# Patient Record
Sex: Female | Born: 1970 | Race: White | Hispanic: No | Marital: Married
Health system: Southern US, Community
[De-identification: ages and names within clinical notes are randomized; demographics above are authoritative.]

## PROBLEM LIST (undated history)

## (undated) DIAGNOSIS — I10 Essential (primary) hypertension: Secondary | ICD-10-CM

## (undated) DIAGNOSIS — E538 Deficiency of other specified B group vitamins: Secondary | ICD-10-CM

## (undated) DIAGNOSIS — E119 Type 2 diabetes mellitus without complications: Secondary | ICD-10-CM

## (undated) HISTORY — PX: CHOLECYSTECTOMY: SHX55

---

## 2001-09-16 ENCOUNTER — Other Ambulatory Visit: Admission: RE | Admit: 2001-09-16 | Discharge: 2001-09-16 | Payer: Self-pay | Admitting: Obstetrics and Gynecology

## 2002-09-27 ENCOUNTER — Other Ambulatory Visit: Admission: RE | Admit: 2002-09-27 | Discharge: 2002-09-27 | Payer: Self-pay | Admitting: Obstetrics and Gynecology

## 2003-10-20 ENCOUNTER — Other Ambulatory Visit: Admission: RE | Admit: 2003-10-20 | Discharge: 2003-10-20 | Payer: Self-pay | Admitting: Obstetrics and Gynecology

## 2004-11-26 ENCOUNTER — Ambulatory Visit (HOSPITAL_COMMUNITY): Admission: RE | Admit: 2004-11-26 | Discharge: 2004-11-26 | Payer: Self-pay | Admitting: Obstetrics and Gynecology

## 2005-02-19 ENCOUNTER — Other Ambulatory Visit: Admission: RE | Admit: 2005-02-19 | Discharge: 2005-02-19 | Payer: Self-pay | Admitting: Obstetrics and Gynecology

## 2005-08-23 ENCOUNTER — Emergency Department (HOSPITAL_COMMUNITY): Admission: EM | Admit: 2005-08-23 | Discharge: 2005-08-23 | Payer: Self-pay | Admitting: Emergency Medicine

## 2005-11-19 ENCOUNTER — Inpatient Hospital Stay (HOSPITAL_COMMUNITY): Admission: AD | Admit: 2005-11-19 | Discharge: 2005-11-19 | Payer: Self-pay | Admitting: Obstetrics and Gynecology

## 2005-11-21 ENCOUNTER — Inpatient Hospital Stay (HOSPITAL_COMMUNITY): Admission: AD | Admit: 2005-11-21 | Discharge: 2005-11-21 | Payer: Self-pay | Admitting: Obstetrics and Gynecology

## 2005-11-24 ENCOUNTER — Inpatient Hospital Stay (HOSPITAL_COMMUNITY): Admission: AD | Admit: 2005-11-24 | Discharge: 2005-11-25 | Payer: Self-pay | Admitting: Obstetrics and Gynecology

## 2005-11-26 ENCOUNTER — Inpatient Hospital Stay (HOSPITAL_COMMUNITY): Admission: AD | Admit: 2005-11-26 | Discharge: 2005-11-28 | Payer: Self-pay | Admitting: Obstetrics and Gynecology

## 2009-11-30 ENCOUNTER — Ambulatory Visit: Payer: Self-pay | Admitting: Internal Medicine

## 2013-12-07 ENCOUNTER — Other Ambulatory Visit: Payer: Self-pay | Admitting: Obstetrics and Gynecology

## 2013-12-09 LAB — CYTOLOGY - PAP

## 2018-03-23 ENCOUNTER — Ambulatory Visit
Admission: RE | Admit: 2018-03-23 | Discharge: 2018-03-23 | Disposition: A | Discharge status: Home / Self Care | Payer: Managed Care, Other (non HMO) | Source: Ambulatory Visit | Attending: Obstetrics and Gynecology | Admitting: Obstetrics and Gynecology

## 2018-03-23 ENCOUNTER — Other Ambulatory Visit: Payer: Self-pay | Admitting: Obstetrics and Gynecology

## 2018-03-23 DIAGNOSIS — Z1231 Encounter for screening mammogram for malignant neoplasm of breast: Secondary | ICD-10-CM | POA: Diagnosis not present

## 2019-02-18 ENCOUNTER — Other Ambulatory Visit: Payer: Self-pay | Admitting: Internal Medicine

## 2019-02-18 DIAGNOSIS — Z1231 Encounter for screening mammogram for malignant neoplasm of breast: Secondary | ICD-10-CM

## 2019-03-30 ENCOUNTER — Ambulatory Visit
Admission: RE | Admit: 2019-03-30 | Discharge: 2019-03-30 | Disposition: A | Discharge status: Home / Self Care | Payer: Managed Care, Other (non HMO) | Source: Ambulatory Visit | Attending: Internal Medicine | Admitting: Internal Medicine

## 2019-03-30 DIAGNOSIS — Z1231 Encounter for screening mammogram for malignant neoplasm of breast: Secondary | ICD-10-CM | POA: Diagnosis not present

## 2019-09-26 ENCOUNTER — Other Ambulatory Visit: Payer: Self-pay

## 2019-09-26 ENCOUNTER — Other Ambulatory Visit
Admission: RE | Admit: 2019-09-26 | Discharge: 2019-09-26 | Disposition: A | Discharge status: Home / Self Care | Payer: 59 | Source: Ambulatory Visit | Attending: Internal Medicine | Admitting: Internal Medicine

## 2019-09-26 DIAGNOSIS — Z01812 Encounter for preprocedural laboratory examination: Secondary | ICD-10-CM | POA: Insufficient documentation

## 2019-09-26 DIAGNOSIS — Z20822 Contact with and (suspected) exposure to covid-19: Secondary | ICD-10-CM | POA: Diagnosis not present

## 2019-09-27 ENCOUNTER — Encounter: Payer: Self-pay | Admitting: Internal Medicine

## 2019-09-27 LAB — SARS CORONAVIRUS 2 (TAT 6-24 HRS): SARS Coronavirus 2: NEGATIVE

## 2019-09-28 ENCOUNTER — Ambulatory Visit
Admission: RE | Admit: 2019-09-28 | Discharge: 2019-09-28 | Disposition: A | Discharge status: Home / Self Care | Payer: 59 | Attending: Internal Medicine | Admitting: Internal Medicine

## 2019-09-28 ENCOUNTER — Ambulatory Visit: Payer: 59 | Admitting: Anesthesiology

## 2019-09-28 ENCOUNTER — Encounter
Admission: RE | Disposition: A | Discharge status: Home / Self Care | Payer: Self-pay | Source: Home / Self Care | Attending: Internal Medicine

## 2019-09-28 ENCOUNTER — Other Ambulatory Visit: Payer: Self-pay

## 2019-09-28 ENCOUNTER — Encounter: Payer: Self-pay | Admitting: Internal Medicine

## 2019-09-28 DIAGNOSIS — E119 Type 2 diabetes mellitus without complications: Secondary | ICD-10-CM | POA: Insufficient documentation

## 2019-09-28 DIAGNOSIS — D120 Benign neoplasm of cecum: Secondary | ICD-10-CM | POA: Diagnosis not present

## 2019-09-28 DIAGNOSIS — Z79899 Other long term (current) drug therapy: Secondary | ICD-10-CM | POA: Diagnosis not present

## 2019-09-28 DIAGNOSIS — Z7982 Long term (current) use of aspirin: Secondary | ICD-10-CM | POA: Insufficient documentation

## 2019-09-28 DIAGNOSIS — K219 Gastro-esophageal reflux disease without esophagitis: Secondary | ICD-10-CM | POA: Diagnosis not present

## 2019-09-28 DIAGNOSIS — Z8371 Family history of colonic polyps: Secondary | ICD-10-CM | POA: Insufficient documentation

## 2019-09-28 DIAGNOSIS — Z1211 Encounter for screening for malignant neoplasm of colon: Secondary | ICD-10-CM | POA: Diagnosis not present

## 2019-09-28 DIAGNOSIS — Z7984 Long term (current) use of oral hypoglycemic drugs: Secondary | ICD-10-CM | POA: Insufficient documentation

## 2019-09-28 DIAGNOSIS — K640 First degree hemorrhoids: Secondary | ICD-10-CM | POA: Diagnosis not present

## 2019-09-28 DIAGNOSIS — I10 Essential (primary) hypertension: Secondary | ICD-10-CM | POA: Diagnosis not present

## 2019-09-28 HISTORY — DX: Deficiency of other specified B group vitamins: E53.8

## 2019-09-28 HISTORY — DX: Essential (primary) hypertension: I10

## 2019-09-28 HISTORY — DX: Type 2 diabetes mellitus without complications: E11.9

## 2019-09-28 HISTORY — PX: COLONOSCOPY WITH PROPOFOL: SHX5780

## 2019-09-28 LAB — GLUCOSE, CAPILLARY: Glucose-Capillary: 107 mg/dL — ABNORMAL HIGH (ref 70–99)

## 2019-09-28 SURGERY — COLONOSCOPY WITH PROPOFOL
Anesthesia: General

## 2019-09-28 MED ORDER — SODIUM CHLORIDE 0.9 % IV SOLN
INTRAVENOUS | Status: DC
  Administered 2019-09-28: 20 mL/h via INTRAVENOUS

## 2019-09-28 MED ORDER — PROPOFOL 500 MG/50ML IV EMUL
INTRAVENOUS | Status: AC
  Filled 2019-09-28: qty 50

## 2019-09-28 MED ORDER — PROPOFOL 500 MG/50ML IV EMUL
INTRAVENOUS | Status: DC | PRN
  Administered 2019-09-28: 150 ug/kg/min via INTRAVENOUS

## 2019-09-28 NOTE — Anesthesia Postprocedure Evaluation (Signed)
Anesthesia Post Note  Patient: Nicole Haas  Procedure(s) Performed: COLONOSCOPY WITH PROPOFOL (N/A )  Patient location during evaluation: Endoscopy Anesthesia Type: General Level of consciousness: awake and alert Pain management: pain level controlled Vital Signs Assessment: post-procedure vital signs reviewed and stable Respiratory status: spontaneous breathing, nonlabored ventilation, respiratory function stable and patient connected to nasal cannula oxygen Cardiovascular status: blood pressure returned to baseline and stable Postop Assessment: no apparent nausea or vomiting Anesthetic complications: no   No complications documented.   Last Vitals:  Vitals:   09/28/19 0918 09/28/19 0928  BP: 109/78 (!) 120/99  Pulse: 94 90  Resp: 17 13  Temp:    SpO2: 98% 98%    Last Pain:  Vitals:   09/28/19 0928  TempSrc:   PainSc: 0-No pain                 Arita Miss

## 2019-09-28 NOTE — Interval H&P Note (Signed)
History and Physical Interval Note:  09/28/2019 8:53 AM  Nicole Haas  has presented today for surgery, with the diagnosis of FH POLYPS.  The various methods of treatment have been discussed with the patient and family. After consideration of risks, benefits and other options for treatment, the patient has consented to  Procedure(s): COLONOSCOPY WITH PROPOFOL (N/A) as a surgical intervention.  The patient's history has been reviewed, patient examined, no change in status, stable for surgery.  I have reviewed the patient's chart and labs.  Questions were answered to the patient's satisfaction.     [place], [place]

## 2019-09-28 NOTE — Anesthesia Procedure Notes (Signed)
Performed by: Cook-Martin, Kit Brubacher Pre-anesthesia Checklist: Patient identified, Emergency Drugs available, Suction available, Patient being monitored and Timeout performed Patient Re-evaluated:Patient Re-evaluated prior to induction Oxygen Delivery Method: Nasal cannula Preoxygenation: Pre-oxygenation with 100% oxygen Induction Type: IV induction Placement Confirmation: positive ETCO2 and CO2 detector       

## 2019-09-28 NOTE — Op Note (Signed)
[facility] Patient Name: Nicole Haas Procedure Date: 09/28/2019 8:50 AM MRN: [id number] Account #: [id number] Date of Birth: January 28, 1971 Admit Type: Outpatient Age: 49 Room: [facility] ENDO ROOM 3 Gender: Female Note Status: Finalized Procedure:             Colonoscopy Indications:           Colon cancer screening in patient at increased risk:                         Family history of 1st-degree relative with colon polyps Providers:             Benay Pike. Alice Reichert MD, MD Referring MD:          Rusty Aus, MD (Referring MD) Medicines:             Propofol per Anesthesia Complications:         No immediate complications. Procedure:             Pre-Anesthesia Assessment:                        - The risks and benefits of the procedure and the                         sedation options and risks were discussed with the                         patient. All questions were answered and informed                         consent was obtained.                        - Patient identification and proposed procedure were                         verified prior to the procedure by the nurse. The                         procedure was verified in the procedure room.                        - ASA Grade Assessment: III - A patient with severe                         systemic disease.                        - After reviewing the risks and benefits, the patient                         was deemed in satisfactory condition to undergo the                         procedure.                        After obtaining informed consent, the colonoscope was  passed under direct vision. Throughout the procedure,                         the patient's blood pressure, pulse, and oxygen                         saturations were monitored continuously. The                         Colonoscope was introduced through the anus and                         advanced to the the  cecum, identified by appendiceal                         orifice and ileocecal valve. The colonoscopy was                         performed without difficulty. The patient tolerated                         the procedure well. The quality of the bowel                         preparation was good. The ileocecal valve, appendiceal                         orifice, and rectum were photographed. Findings:      The perianal and digital rectal examinations were normal. Pertinent       negatives include normal sphincter tone and no palpable rectal lesions.      A 5 mm polyp was found in the cecum. The polyp was sessile. The polyp       was removed with a jumbo cold forceps. Resection and retrieval were       complete.      Non-bleeding internal hemorrhoids were found during retroflexion. The       hemorrhoids were Grade I (internal hemorrhoids that do not prolapse).      The exam was otherwise without abnormality. Impression:            - One 5 mm polyp in the cecum, removed with a jumbo                         cold forceps. Resected and retrieved.                        - Non-bleeding internal hemorrhoids.                        - The examination was otherwise normal. Recommendation:        - Patient has a contact number available for                         emergencies. The signs and symptoms of potential                         delayed complications were discussed with the patient.  Return to normal activities tomorrow. Written                         discharge instructions were provided to the patient.                        - Resume previous diet.                        - Continue present medications.                        - Repeat colonoscopy is recommended for surveillance.                         The colonoscopy date will be determined after                         pathology results from today's exam become available                         for review.                         - Return to GI office PRN.                        - The findings and recommendations were discussed with                         the patient. Procedure Code(s):     --- Professional ---                        [phone], Colonoscopy, flexible; with biopsy, single or                         multiple Diagnosis Code(s):     --- Professional ---                        K64.0, First degree hemorrhoids                        K63.5, Polyp of colon                        Z83.71, Family history of colonic polyps CPT copyright 2019 [employer]. All rights reserved. The codes documented in this report are preliminary and upon coder review may  be revised to meet current compliance requirements. Efrain Sella MD, MD 09/28/2019 9:17:53 AM This report has been signed electronically. Number of Addenda: 0 Note Initiated On: 09/28/2019 8:50 AM Scope Withdrawal Time: 0 hours 8 minutes 14 seconds  Total Procedure Duration: 0 hours 16 minutes 15 seconds  Estimated Blood Loss:  Estimated blood loss: none.      [facility]

## 2019-09-28 NOTE — Transfer of Care (Signed)
Immediate Anesthesia Transfer of Care Note  Patient: Nicole Haas  Procedure(s) Performed: COLONOSCOPY WITH PROPOFOL (N/A )  Patient Location: PACU  Anesthesia Type:General  Level of Consciousness: awake and sedated  Airway & Oxygen Therapy: Patient Spontanous Breathing and Patient connected to nasal cannula oxygen  Post-op Assessment: Report given to RN and Post -op Vital signs reviewed and stable  Post vital signs: Reviewed and stable  Last Vitals:  Vitals Value Taken Time  BP    Temp    Pulse    Resp    SpO2      Last Pain:  Vitals:   09/28/19 0809  TempSrc: Temporal  PainSc: 0-No pain         Complications: No complications documented.

## 2019-09-28 NOTE — Anesthesia Preprocedure Evaluation (Signed)
Anesthesia Evaluation  Patient identified by MRN, date of birth, ID band Patient awake    Reviewed: Allergy & Precautions, NPO status , Patient's Chart, lab work & pertinent test results  History of Anesthesia Complications Negative for: history of anesthetic complications  Airway Mallampati: II  TM Distance: >3 FB Neck ROM: Full    Dental no notable dental hx. (+) Teeth Intact   Pulmonary neg pulmonary ROS, neg sleep apnea, neg COPD, Patient abstained from smoking.Not current smoker,    Pulmonary exam normal breath sounds clear to auscultation       Cardiovascular Exercise Tolerance: Good METShypertension, (-) CAD and (-) Past MI (-) dysrhythmias  Rhythm:Regular Rate:Normal - Systolic murmurs    Neuro/Psych negative neurological ROS  negative psych ROS   GI/Hepatic GERD  Medicated and Controlled,(+)     (-) substance abuse  ,   Endo/Other  diabetes  Renal/GU negative Renal ROS     Musculoskeletal   Abdominal   Peds  Hematology   Anesthesia Other Findings Past Medical History: No date: B12 deficiency No date: Diabetes mellitus without complication (HCC) No date: Hypertension  Reproductive/Obstetrics                             Anesthesia Physical Anesthesia Plan  ASA: II  Anesthesia Plan: General   Post-op Pain Management:    Induction: Intravenous  PONV Risk Score and Plan: 3 and Ondansetron, Propofol infusion and TIVA  Airway Management Planned: Nasal Cannula  Additional Equipment: None  Intra-op Plan:   Post-operative Plan:   Informed Consent: I have reviewed the patients History and Physical, chart, labs and discussed the procedure including the risks, benefits and alternatives for the proposed anesthesia with the patient or authorized representative who has indicated his/her understanding and acceptance.     Dental advisory given  Plan Discussed with: CRNA and  Surgeon  Anesthesia Plan Comments: (Discussed risks of anesthesia with patient, including possibility of difficulty with spontaneous ventilation under anesthesia necessitating airway intervention, PONV, and rare risks such as cardiac or respiratory or neurological events. Patient understands.)        Anesthesia Quick Evaluation

## 2019-09-28 NOTE — H&P (Signed)
Outpatient short stay form Pre-procedure 09/28/2019 8:52 AM Nicole Haas K. Nicole Haas, M.D.  Primary Physician: Nicole Haas, M.D.  Reason for visit: Family history of colon polyps  History of present illness:   49 year old patient presenting for family history of colon polyps. Patient denies any change in bowel habits, rectal bleeding or involuntary weight loss.     Current Facility-Administered Medications:  .  0.9 %  sodium chloride infusion, , Intravenous, Continuous, [place], Benay Pike, MD, Last Rate: 20 mL/hr at 09/28/19 0819, 20 mL/hr at 09/28/19 9166  Medications Prior to Admission  Medication Sig Dispense Refill Last Dose  . ascorbic acid (VITAMIN C) 1000 MG tablet Take 1,000 mg by mouth daily.   Past Week at Unknown time  . aspirin EC 81 MG tablet Take 81 mg by mouth daily. Swallow whole.   Past Week at Unknown time  . cetirizine (ZYRTEC) 10 MG tablet Take 10 mg by mouth daily.   Past Week at Unknown time  . cholecalciferol (VITAMIN D3) 10 MCG (400 UNIT) TABS tablet Take 1,000 Units by mouth.   Past Week at Unknown time  . famotidine (PEPCID) 40 MG tablet Take 40 mg by mouth daily.   Past Week at Unknown time  . ibuprofen (ADVIL) 200 MG tablet Take 200 mg by mouth every 6 (six) hours as needed.   Past Week at Unknown time  . lisinopril-hydrochlorothiazide (ZESTORETIC) 10-12.5 MG tablet Take 1 tablet by mouth daily.   Past Week at Unknown time  . metFORMIN (GLUCOPHAGE) 500 MG tablet Take by mouth 2 (two) times daily with a meal.   Past Week at Unknown time  . Semaglutide (OZEMPIC, 1 MG/DOSE, [place]) Inject 1 mg into the skin.   Past Week at Unknown time     Allergies  Allergen Reactions  . Penicillins Hives  . Sulfa Antibiotics Hives     Past Medical History:  Diagnosis Date  . B12 deficiency   . Diabetes mellitus without complication ([place])   . Hypertension     Review of systems:  Otherwise negative.    Physical Exam  Gen: Alert, oriented. Appears stated age.  HEENT:  [place]/AT. PERRLA. Lungs: CTA, no wheezes. CV: RR nl S1, S2. Abd: soft, benign, no masses. BS+ Ext: No edema. Pulses 2+    Planned procedures: Proceed with colonoscopy. The patient understands the nature of the planned procedure, indications, risks, alternatives and potential complications including but not limited to bleeding, infection, perforation, damage to internal organs and possible oversedation/side effects from anesthesia. The patient agrees and gives consent to proceed.  Please refer to procedure notes for findings, recommendations and patient disposition/instructions.     Nicole Haas K. Nicole Haas, M.D. Gastroenterology 09/28/2019  8:52 AM

## 2019-09-29 ENCOUNTER — Encounter: Payer: Self-pay | Admitting: Internal Medicine

## 2019-09-29 LAB — SURGICAL PATHOLOGY

## 2019-09-29 NOTE — Progress Notes (Signed)
   09/28/19 0750  Clinical Encounter Type  Visited With Patient  Visit Type Initial  Referral From Chaplain  Consult/Referral To Chaplain  While rounding SDS waiting area, chaplain briefly visited with patient. When asked how she was feeling, patient said she felt anxiety. Chaplain told patient that she would be praying of her.

## 2020-03-20 ENCOUNTER — Other Ambulatory Visit: Payer: Self-pay | Admitting: Internal Medicine

## 2020-03-20 DIAGNOSIS — Z1231 Encounter for screening mammogram for malignant neoplasm of breast: Secondary | ICD-10-CM

## 2020-04-11 ENCOUNTER — Ambulatory Visit
Admission: RE | Admit: 2020-04-11 | Discharge: 2020-04-11 | Disposition: A | Discharge status: Home / Self Care | Payer: 59 | Source: Ambulatory Visit | Attending: Internal Medicine | Admitting: Internal Medicine

## 2020-04-11 ENCOUNTER — Other Ambulatory Visit: Payer: Self-pay

## 2020-04-11 DIAGNOSIS — Z1231 Encounter for screening mammogram for malignant neoplasm of breast: Secondary | ICD-10-CM | POA: Diagnosis not present

## 2021-03-15 ENCOUNTER — Other Ambulatory Visit: Payer: Self-pay | Admitting: Internal Medicine

## 2021-03-15 DIAGNOSIS — Z1231 Encounter for screening mammogram for malignant neoplasm of breast: Secondary | ICD-10-CM

## 2021-04-23 ENCOUNTER — Ambulatory Visit
Admission: RE | Admit: 2021-04-23 | Discharge: 2021-04-23 | Disposition: A | Discharge status: Home / Self Care | Payer: 59 | Source: Ambulatory Visit | Attending: Internal Medicine | Admitting: Internal Medicine

## 2021-04-23 ENCOUNTER — Other Ambulatory Visit: Payer: Self-pay

## 2021-04-23 DIAGNOSIS — Z1231 Encounter for screening mammogram for malignant neoplasm of breast: Secondary | ICD-10-CM | POA: Insufficient documentation

## 2021-04-29 ENCOUNTER — Other Ambulatory Visit: Payer: Self-pay | Admitting: Internal Medicine

## 2021-04-29 DIAGNOSIS — N63 Unspecified lump in unspecified breast: Secondary | ICD-10-CM

## 2021-04-29 DIAGNOSIS — R928 Other abnormal and inconclusive findings on diagnostic imaging of breast: Secondary | ICD-10-CM

## 2021-04-30 ENCOUNTER — Ambulatory Visit
Admission: RE | Admit: 2021-04-30 | Discharge: 2021-04-30 | Disposition: A | Discharge status: Home / Self Care | Payer: 59 | Source: Ambulatory Visit | Attending: Internal Medicine | Admitting: Internal Medicine

## 2021-04-30 ENCOUNTER — Other Ambulatory Visit: Payer: Self-pay

## 2021-04-30 DIAGNOSIS — N63 Unspecified lump in unspecified breast: Secondary | ICD-10-CM | POA: Diagnosis present

## 2021-04-30 DIAGNOSIS — R928 Other abnormal and inconclusive findings on diagnostic imaging of breast: Secondary | ICD-10-CM | POA: Insufficient documentation

## 2021-05-07 ENCOUNTER — Other Ambulatory Visit: Payer: Self-pay | Admitting: Internal Medicine

## 2021-05-07 DIAGNOSIS — R928 Other abnormal and inconclusive findings on diagnostic imaging of breast: Secondary | ICD-10-CM

## 2021-05-07 DIAGNOSIS — N63 Unspecified lump in unspecified breast: Secondary | ICD-10-CM

## 2021-05-22 ENCOUNTER — Ambulatory Visit
Admission: RE | Admit: 2021-05-22 | Discharge: 2021-05-22 | Disposition: A | Discharge status: Home / Self Care | Payer: 59 | Source: Ambulatory Visit | Attending: Internal Medicine | Admitting: Internal Medicine

## 2021-05-22 DIAGNOSIS — N63 Unspecified lump in unspecified breast: Secondary | ICD-10-CM | POA: Insufficient documentation

## 2021-05-22 DIAGNOSIS — R928 Other abnormal and inconclusive findings on diagnostic imaging of breast: Secondary | ICD-10-CM | POA: Diagnosis present

## 2021-05-22 DIAGNOSIS — Z9889 Other specified postprocedural states: Secondary | ICD-10-CM

## 2021-05-22 HISTORY — DX: Other specified postprocedural states: Z98.890

## 2021-05-23 LAB — SURGICAL PATHOLOGY

## 2022-04-09 ENCOUNTER — Other Ambulatory Visit: Payer: Self-pay | Admitting: Internal Medicine

## 2022-04-09 DIAGNOSIS — Z1231 Encounter for screening mammogram for malignant neoplasm of breast: Secondary | ICD-10-CM

## 2022-04-25 ENCOUNTER — Ambulatory Visit
Admission: RE | Admit: 2022-04-25 | Discharge: 2022-04-25 | Disposition: A | Discharge status: Home / Self Care | Payer: 59 | Source: Ambulatory Visit | Attending: Internal Medicine | Admitting: Internal Medicine

## 2022-04-25 DIAGNOSIS — Z1231 Encounter for screening mammogram for malignant neoplasm of breast: Secondary | ICD-10-CM | POA: Diagnosis present

## 2023-03-30 ENCOUNTER — Other Ambulatory Visit: Payer: Self-pay | Admitting: Internal Medicine

## 2023-03-30 DIAGNOSIS — Z1231 Encounter for screening mammogram for malignant neoplasm of breast: Secondary | ICD-10-CM

## 2023-04-27 ENCOUNTER — Ambulatory Visit
Admission: RE | Admit: 2023-04-27 | Discharge: 2023-04-27 | Disposition: A | Discharge status: Home / Self Care | Payer: 59 | Source: Ambulatory Visit | Attending: Internal Medicine | Admitting: Internal Medicine

## 2023-04-27 DIAGNOSIS — Z1231 Encounter for screening mammogram for malignant neoplasm of breast: Secondary | ICD-10-CM | POA: Diagnosis present

## 2023-10-19 IMAGING — MG MM BREAST LOCALIZATION CLIP
4 series · 4 of 12 positions shown · non-contrast
Comparison: Previous exam(s).

CLINICAL DATA: Postprocedure mammogram.

EXAM:
3D DIAGNOSTIC RIGHT MAMMOGRAM POST ULTRASOUND BIOPSY

[R CC synth-2D]
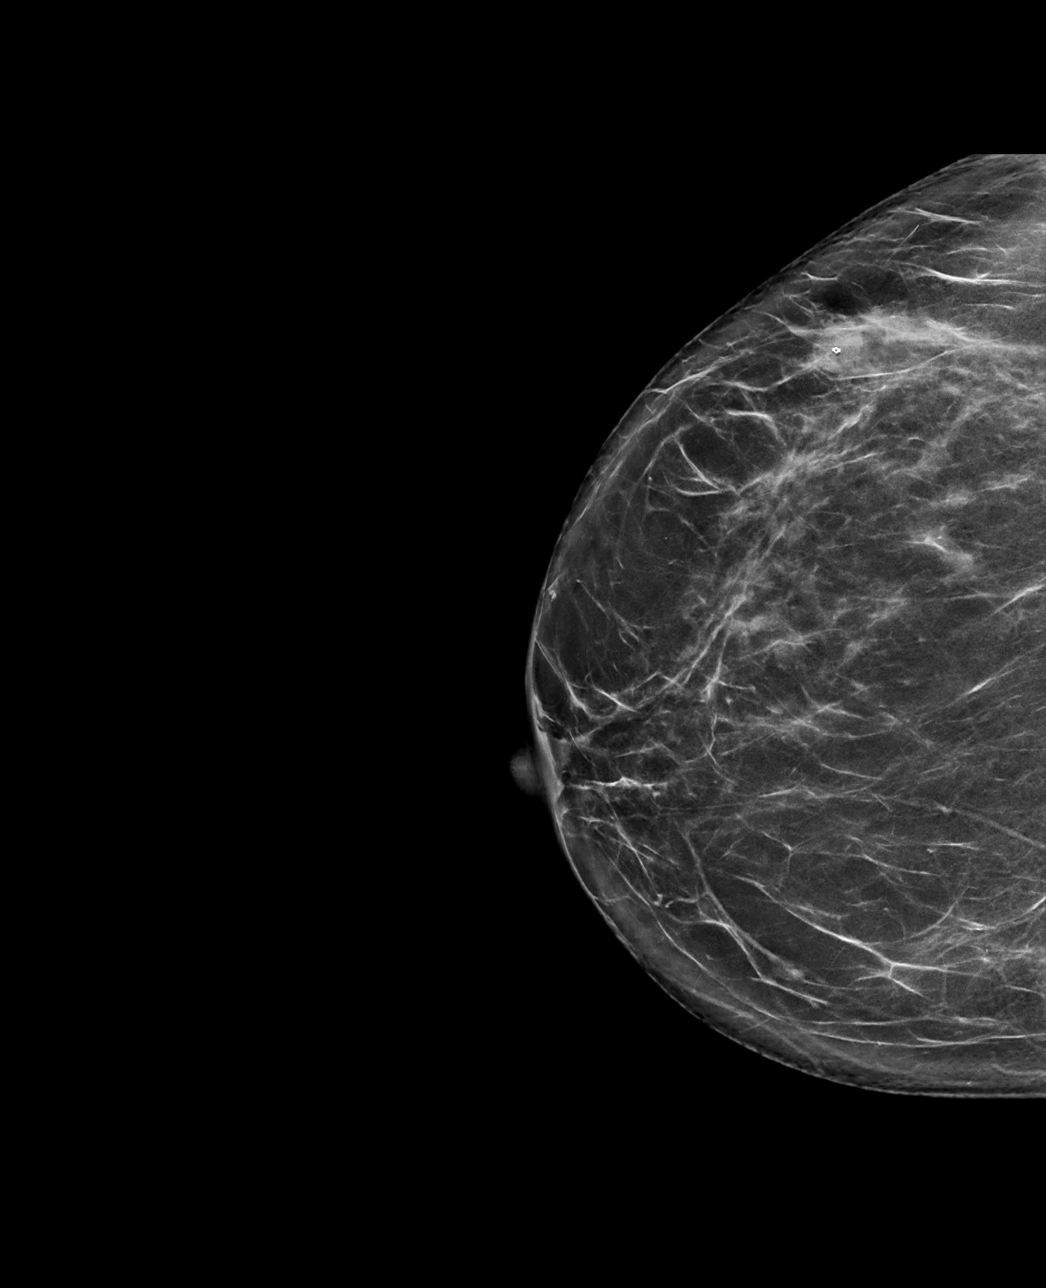

[R ML synth-2D]
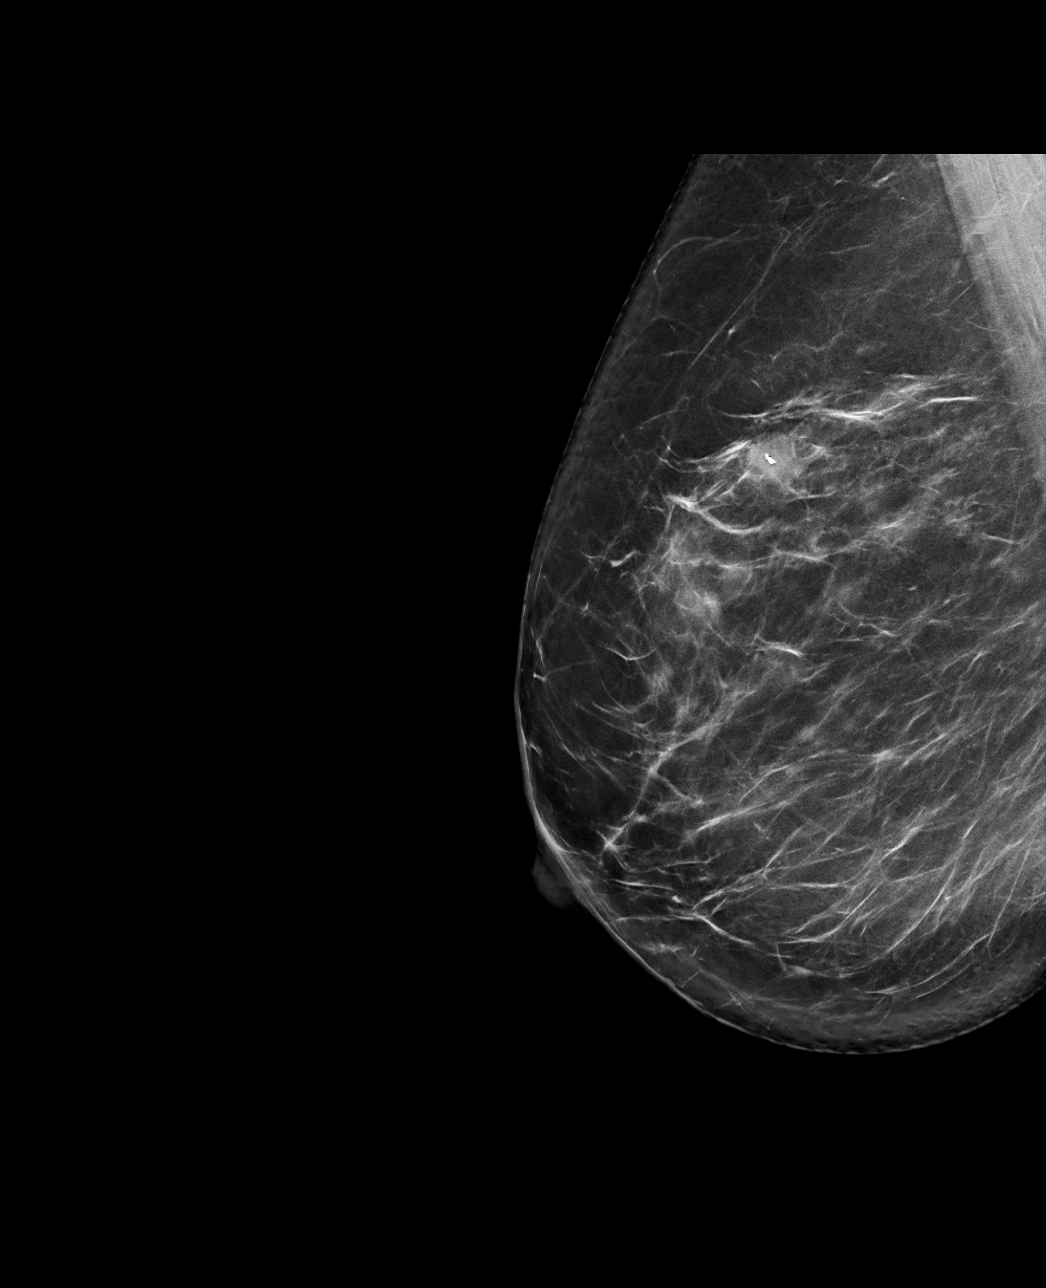

[R CC tomo · tomo slice 41/80.0]
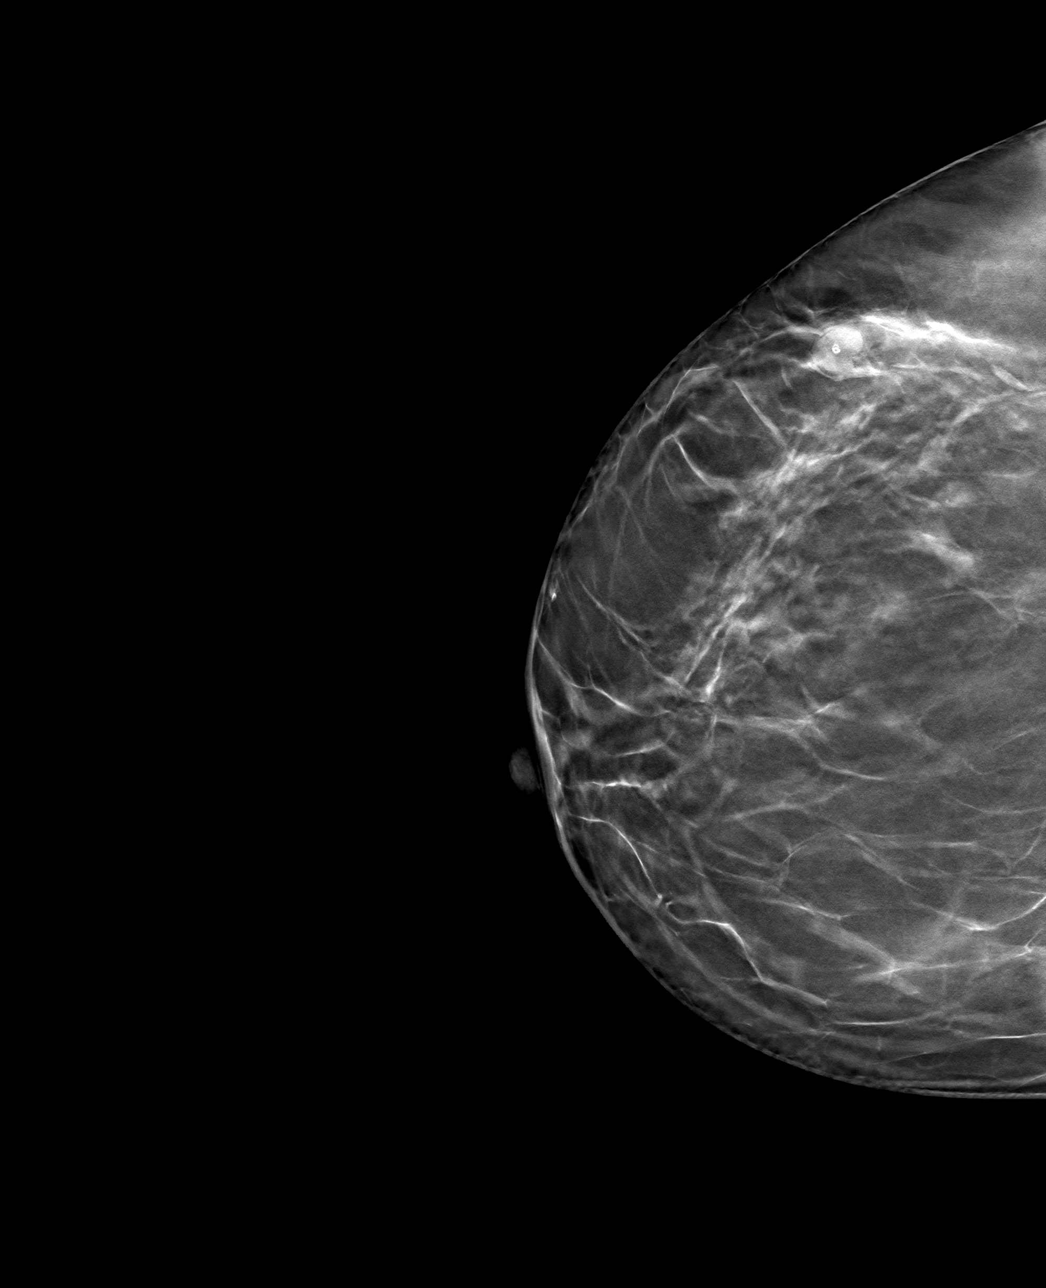

[R ML tomo · tomo slice 45/88.0]
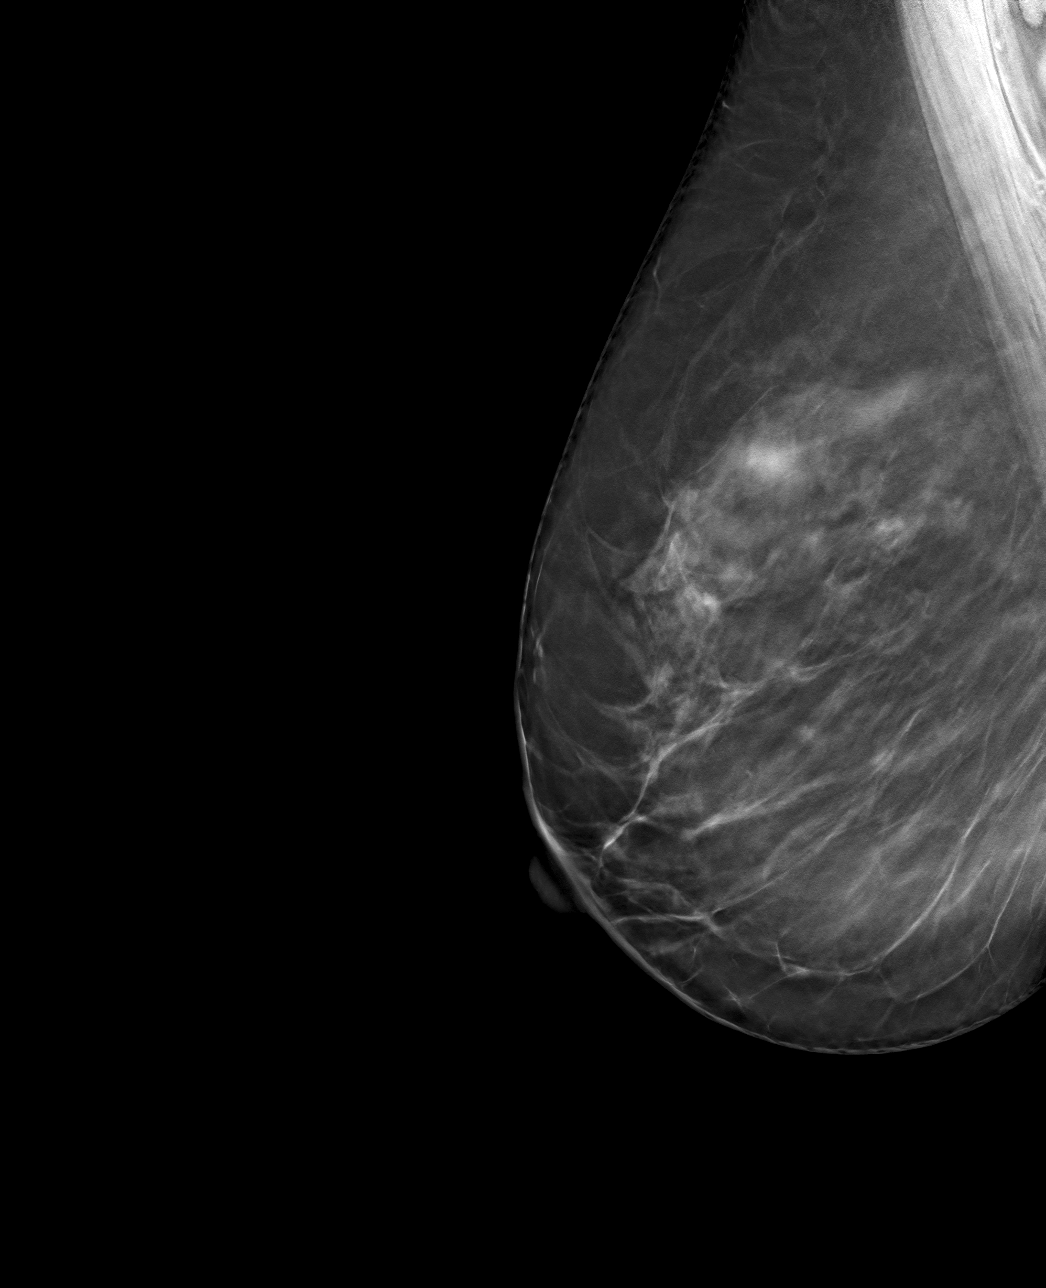

[4 of 12 positions shown; findings below may reference images not displayed]

FINDINGS: 3D Mammographic images were obtained following ultrasound guided
biopsy of a 0.9 cm mass in the right breast [DATE] position. The
biopsy marking clip is in expected position at the site of biopsy.
IMPRESSION: Appropriate positioning of the coil shaped biopsy marking clip at
the site of biopsy in the upper outer right breast at middle depth.

Final Assessment: Post Procedure Mammograms for Marker Placement
